# Patient Record
Sex: Female | Born: 1985 | Race: Black or African American | Hispanic: No | Marital: Single | Smoking: Former smoker
Health system: Southern US, Community
[De-identification: ages and names within clinical notes are randomized; demographics above are authoritative.]

## PROBLEM LIST (undated history)

## (undated) DIAGNOSIS — J40 Bronchitis, not specified as acute or chronic: Secondary | ICD-10-CM

## (undated) DIAGNOSIS — Z9109 Other allergy status, other than to drugs and biological substances: Secondary | ICD-10-CM

---

## 2012-02-04 ENCOUNTER — Encounter (HOSPITAL_BASED_OUTPATIENT_CLINIC_OR_DEPARTMENT_OTHER): Payer: Self-pay

## 2012-02-04 ENCOUNTER — Emergency Department (HOSPITAL_BASED_OUTPATIENT_CLINIC_OR_DEPARTMENT_OTHER): Payer: Self-pay

## 2012-02-04 ENCOUNTER — Emergency Department (HOSPITAL_BASED_OUTPATIENT_CLINIC_OR_DEPARTMENT_OTHER)
Admission: EM | Admit: 2012-02-04 | Discharge: 2012-02-04 | Disposition: A | Discharge status: Home / Self Care | Payer: Self-pay | Attending: Emergency Medicine | Admitting: Emergency Medicine

## 2012-02-04 DIAGNOSIS — J40 Bronchitis, not specified as acute or chronic: Secondary | ICD-10-CM | POA: Insufficient documentation

## 2012-02-04 DIAGNOSIS — Z87891 Personal history of nicotine dependence: Secondary | ICD-10-CM | POA: Insufficient documentation

## 2012-02-04 MED ORDER — ALBUTEROL SULFATE HFA 108 (90 BASE) MCG/ACT IN AERS: 1.0000 | INHALATION_SPRAY | Freq: Four times a day (QID) | RESPIRATORY_TRACT | Status: AC | PRN

## 2012-02-04 MED ORDER — ALBUTEROL SULFATE (5 MG/ML) 0.5% IN NEBU
5.0000 mg | INHALATION_SOLUTION | Freq: Once | RESPIRATORY_TRACT | Status: AC
  Administered 2012-02-04: 5 mg via RESPIRATORY_TRACT
  Filled 2012-02-04: qty 1

## 2012-02-04 MED ORDER — AZITHROMYCIN 250 MG PO TABS: ORAL_TABLET | ORAL | Status: AC

## 2012-02-04 NOTE — ED Provider Notes (Signed)
Medical screening examination/treatment/procedure(s) were performed by non-physician practitioner and as supervising physician I was immediately available for consultation/collaboration.   Tedford Berg, MD 02/04/12 2342 

## 2012-02-04 NOTE — ED Notes (Signed)
C/o prod cough x 2-3 months

## 2012-02-04 NOTE — ED Provider Notes (Signed)
History     CSN: [id number]  Arrival date & time 02/04/12  1439   First MD Initiated Contact with Patient 02/04/12 1520      Chief Complaint  Patient presents with  . Cough    (Consider location/radiation/quality/duration/timing/severity/associated sxs/prior treatment) Patient is a 26 y.o. female presenting with cough. The history is provided by the patient. No language interpreter was used.  Cough This is a new problem. Episode onset: 3 months. The problem occurs constantly. The cough is productive of sputum. There has been no fever. Associated symptoms include shortness of breath. She has tried nothing for the symptoms. Risk factors: none. She is not a smoker. Her past medical history does not include pneumonia.  Pt reports she has been coughing for 3 months.   Pt reports symptoms started with sinuses.   History reviewed. No pertinent past medical history.  History reviewed. No pertinent past surgical history.  No family history on file.  History  Substance Use Topics  . Smoking status: Former Games developer  . Smokeless tobacco: Not on file  . Alcohol Use: No    OB History    Grav Para Term Preterm Abortions TAB SAB Ect Mult Living                  Review of Systems  Respiratory: Positive for cough and shortness of breath.   All other systems reviewed and are negative.    Allergies  Penicillins  Home Medications  No current outpatient prescriptions on file.  BP 127/83  Pulse 88  Temp 99 F (37.2 C) (Oral)  Resp 20  Ht 5\' 6"  (1.676 m)  Wt 230 lb (104.327 kg)  BMI 37.12 kg/m2  SpO2 100%  LMP 01/02/2012  Physical Exam  Nursing note and vitals reviewed. Constitutional: She is oriented to person, place, and time. She appears well-developed and well-nourished.  HENT:  Head: Normocephalic.  Right Ear: External ear normal.  Left Ear: External ear normal.  Nose: Nose normal.  Mouth/Throat: Oropharynx is clear and moist.  Eyes: Conjunctivae normal and EOM are  normal. Pupils are equal, round, and reactive to light.  Neck: Normal range of motion. Neck supple.  Cardiovascular: Normal rate and normal heart sounds.   Pulmonary/Chest: Effort normal and breath sounds normal.  Abdominal: Soft.  Musculoskeletal: Normal range of motion.  Neurological: She is alert and oriented to person, place, and time.  Skin: Skin is warm.  Psychiatric: She has a normal mood and affect.    ED Course  Procedures (including critical care time)  Labs Reviewed - No data to display Dg Chest 2 View  02/04/2012  *RADIOLOGY REPORT*  Clinical Data: Cough, cold, congestion.  Fever.  CHEST - 2 VIEW  Comparison: None  Findings: Heart and mediastinal contours are within normal limits. No focal opacities or effusions.  No acute bony abnormality.  IMPRESSION: No active cardiopulmonary disease.   Original Report Authenticated By: Cyndie Chime, M.D.      1. Bronchitis       MDM   rx for albuterol and zithromax       Elson Areas, [place] 02/04/12 1702

## 2012-08-31 ENCOUNTER — Encounter (HOSPITAL_BASED_OUTPATIENT_CLINIC_OR_DEPARTMENT_OTHER): Payer: Self-pay

## 2012-08-31 ENCOUNTER — Emergency Department (HOSPITAL_BASED_OUTPATIENT_CLINIC_OR_DEPARTMENT_OTHER)
Admission: EM | Admit: 2012-08-31 | Discharge: 2012-08-31 | Disposition: A | Discharge status: Home / Self Care | Payer: Self-pay | Attending: Emergency Medicine | Admitting: Emergency Medicine

## 2012-08-31 ENCOUNTER — Emergency Department (HOSPITAL_BASED_OUTPATIENT_CLINIC_OR_DEPARTMENT_OTHER): Payer: Self-pay

## 2012-08-31 DIAGNOSIS — R059 Cough, unspecified: Secondary | ICD-10-CM | POA: Insufficient documentation

## 2012-08-31 DIAGNOSIS — R05 Cough: Secondary | ICD-10-CM | POA: Insufficient documentation

## 2012-08-31 DIAGNOSIS — Z880 Allergy status to penicillin: Secondary | ICD-10-CM | POA: Insufficient documentation

## 2012-08-31 DIAGNOSIS — L259 Unspecified contact dermatitis, unspecified cause: Secondary | ICD-10-CM | POA: Insufficient documentation

## 2012-08-31 DIAGNOSIS — J9801 Acute bronchospasm: Secondary | ICD-10-CM | POA: Insufficient documentation

## 2012-08-31 DIAGNOSIS — Z79899 Other long term (current) drug therapy: Secondary | ICD-10-CM | POA: Insufficient documentation

## 2012-08-31 DIAGNOSIS — R062 Wheezing: Secondary | ICD-10-CM | POA: Insufficient documentation

## 2012-08-31 DIAGNOSIS — Z87891 Personal history of nicotine dependence: Secondary | ICD-10-CM | POA: Insufficient documentation

## 2012-08-31 DIAGNOSIS — L309 Dermatitis, unspecified: Secondary | ICD-10-CM

## 2012-08-31 DIAGNOSIS — Z8709 Personal history of other diseases of the respiratory system: Secondary | ICD-10-CM | POA: Insufficient documentation

## 2012-08-31 HISTORY — DX: Bronchitis, not specified as acute or chronic: J40

## 2012-08-31 HISTORY — DX: Other allergy status, other than to drugs and biological substances: Z91.09

## 2012-08-31 MED ORDER — PREDNISONE 50 MG PO TABS
60.0000 mg | ORAL_TABLET | Freq: Once | ORAL | Status: AC
  Administered 2012-08-31: 60 mg via ORAL
  Filled 2012-08-31: qty 1

## 2012-08-31 MED ORDER — IPRATROPIUM BROMIDE 0.02 % IN SOLN
RESPIRATORY_TRACT | Status: AC
  Administered 2012-08-31: 0.5 mg via RESPIRATORY_TRACT
  Filled 2012-08-31: qty 2.5

## 2012-08-31 MED ORDER — PREDNISONE 20 MG PO TABS: 40.0000 mg | ORAL_TABLET | Freq: Every day | ORAL | Status: AC

## 2012-08-31 MED ORDER — ALBUTEROL SULFATE HFA 108 (90 BASE) MCG/ACT IN AERS: 1.0000 | INHALATION_SPRAY | Freq: Four times a day (QID) | RESPIRATORY_TRACT | Status: AC | PRN

## 2012-08-31 MED ORDER — ALBUTEROL SULFATE (5 MG/ML) 0.5% IN NEBU
INHALATION_SOLUTION | RESPIRATORY_TRACT | Status: AC
  Administered 2012-08-31: 5 mg via RESPIRATORY_TRACT
  Filled 2012-08-31: qty 1

## 2012-08-31 MED ORDER — ALBUTEROL SULFATE HFA 108 (90 BASE) MCG/ACT IN AERS
2.0000 | INHALATION_SPRAY | Freq: Once | RESPIRATORY_TRACT | Status: DC
  Filled 2012-08-31: qty 6.7

## 2012-08-31 MED ORDER — IPRATROPIUM BROMIDE 0.02 % IN SOLN
0.5000 mg | Freq: Once | RESPIRATORY_TRACT | Status: AC
  Administered 2012-08-31: 0.5 mg via RESPIRATORY_TRACT

## 2012-08-31 MED ORDER — BETAMETHASONE VALERATE 0.1 % EX CREA: TOPICAL_CREAM | Freq: Two times a day (BID) | CUTANEOUS | Status: AC

## 2012-08-31 MED ORDER — ALBUTEROL SULFATE (5 MG/ML) 0.5% IN NEBU
5.0000 mg | INHALATION_SOLUTION | Freq: Once | RESPIRATORY_TRACT | Status: AC
  Administered 2012-08-31: 5 mg via RESPIRATORY_TRACT

## 2012-08-31 NOTE — ED Notes (Signed)
RT Note: Patient was instructed on proper MDI use with spacer. Patient demonstrated technique well with no complications. She was familiar with using an inhaler. Patient had no questions currently. Rt will continue to monitor.

## 2012-08-31 NOTE — ED Notes (Signed)
Pt states that she is having shortness of breath, in nad at this time, upper airway congestion heard, LS clear.  Pt states that she was just admitted to [facility] for 3 days, dx bronchitis.

## 2012-09-07 NOTE — ED Provider Notes (Signed)
History    27 year old female with cough and shortness of breath. Gradual onset yesterday. Progressively worsening. No fevers or chills. Mild shortness of breath. Denies any chest or back pain. No unusual leg pain or swelling. Patient states recent hospitalization at outside facility several weeks ago with bronchitis.   CSN: [id number]  Arrival date & time 08/31/12  0806   First MD Initiated Contact with Patient 08/31/12 [phone]      Chief Complaint  Patient presents with  . Shortness of Breath    (Consider location/radiation/quality/duration/timing/severity/associated sxs/prior treatment) HPI  Past Medical History  Diagnosis Date  . Bronchitis   . Environmental allergies     History reviewed. No pertinent past surgical history.  History reviewed. No pertinent family history.  History  Substance Use Topics  . Smoking status: Former Games developer  . Smokeless tobacco: Not on file  . Alcohol Use: No    OB History   Grav Para Term Preterm Abortions TAB SAB Ect Mult Living                  Review of Systems  All systems reviewed and negative, other than as noted in HPI.   Allergies  Penicillins  Home Medications   Current Outpatient Rx  Name  Route  Sig  Dispense  Refill  . albuterol (PROVENTIL HFA;VENTOLIN HFA) 108 (90 BASE) MCG/ACT inhaler   Inhalation   Inhale 1-2 puffs into the lungs every 6 (six) hours as needed for wheezing.   1 Inhaler   0   . albuterol (PROVENTIL HFA;VENTOLIN HFA) 108 (90 BASE) MCG/ACT inhaler   Inhalation   Inhale 1-2 puffs into the lungs every 6 (six) hours as needed for wheezing.   1 Inhaler   2   . azithromycin (ZITHROMAX Z-PAK) 250 MG tablet      Two tablets 1st day then one tablet a day   6 tablet   0   . betamethasone valerate (VALISONE) 0.1 % cream   Topical   Apply topically 2 (two) times daily.   30 g   0   . predniSONE (DELTASONE) 20 MG tablet   Oral   Take 2 tablets (40 mg total) by mouth daily.   10 tablet   0      BP 147/80  Pulse 119  Temp(Src) 98.3 F (36.8 C) (Oral)  Resp 23  Ht 5\' 4"  (1.626 m)  Wt 230 lb (104.327 kg)  BMI 39.46 kg/m2  SpO2 99%  LMP 08/24/2012  Physical Exam  Nursing note and vitals reviewed. Constitutional: She appears well-developed and well-nourished. No distress.  HENT:  Head: Normocephalic and atraumatic.  Eyes: Conjunctivae are normal. Right eye exhibits no discharge. Left eye exhibits no discharge.  Neck: Neck supple.  Cardiovascular: Normal rate, regular rhythm and normal heart sounds.  Exam reveals no gallop and no friction rub.   No murmur heard. Pulmonary/Chest: Effort normal. No respiratory distress. She has wheezes.  Mild expiratory wheezing bilaterally. Speaking in complete sentences. No accessory muscle usage.  Abdominal: Soft. She exhibits no distension. There is no tenderness.  Musculoskeletal: She exhibits no edema and no tenderness.  Lower extremities symmetric as compared to each other. No calf tenderness. Negative Homan's. No palpable cords.   Neurological: She is alert.  Skin: Skin is warm and dry.  Psychiatric: She has a normal mood and affect. Her behavior is normal. Thought content normal.    ED Course  Procedures (including critical care time)  Labs Reviewed - No data to  display No results found.   1. Bronchospasm   2. Eczema       MDM  27 year old female with likely bronchitis. Chest x-ray is clear. No indication for antibiotics. Plan course of steroids. Patient was given the albuterol inhaler prior to discharge. She is afebrile. No significant respiratory distress on exam.       Raeford Razor, MD 09/07/12 [phone]

## 2012-11-23 ENCOUNTER — Encounter (HOSPITAL_BASED_OUTPATIENT_CLINIC_OR_DEPARTMENT_OTHER): Payer: Self-pay | Admitting: *Deleted

## 2012-11-23 ENCOUNTER — Emergency Department (HOSPITAL_BASED_OUTPATIENT_CLINIC_OR_DEPARTMENT_OTHER)
Admission: EM | Admit: 2012-11-23 | Discharge: 2012-11-23 | Disposition: A | Discharge status: Home / Self Care | Payer: Self-pay | Attending: Emergency Medicine | Admitting: Emergency Medicine

## 2012-11-23 DIAGNOSIS — Z87891 Personal history of nicotine dependence: Secondary | ICD-10-CM | POA: Insufficient documentation

## 2012-11-23 DIAGNOSIS — L0231 Cutaneous abscess of buttock: Secondary | ICD-10-CM | POA: Insufficient documentation

## 2012-11-23 DIAGNOSIS — Z9109 Other allergy status, other than to drugs and biological substances: Secondary | ICD-10-CM | POA: Insufficient documentation

## 2012-11-23 DIAGNOSIS — L0291 Cutaneous abscess, unspecified: Secondary | ICD-10-CM

## 2012-11-23 DIAGNOSIS — IMO0002 Reserved for concepts with insufficient information to code with codable children: Secondary | ICD-10-CM | POA: Insufficient documentation

## 2012-11-23 DIAGNOSIS — J40 Bronchitis, not specified as acute or chronic: Secondary | ICD-10-CM | POA: Insufficient documentation

## 2012-11-23 DIAGNOSIS — Z79899 Other long term (current) drug therapy: Secondary | ICD-10-CM | POA: Insufficient documentation

## 2012-11-23 DIAGNOSIS — L03317 Cellulitis of buttock: Secondary | ICD-10-CM | POA: Insufficient documentation

## 2012-11-23 DIAGNOSIS — Z880 Allergy status to penicillin: Secondary | ICD-10-CM | POA: Insufficient documentation

## 2012-11-23 DIAGNOSIS — Z792 Long term (current) use of antibiotics: Secondary | ICD-10-CM | POA: Insufficient documentation

## 2012-11-23 DIAGNOSIS — L02419 Cutaneous abscess of limb, unspecified: Secondary | ICD-10-CM | POA: Insufficient documentation

## 2012-11-23 MED ORDER — SULFAMETHOXAZOLE-TRIMETHOPRIM 800-160 MG PO TABS: 1.0000 | ORAL_TABLET | Freq: Two times a day (BID) | ORAL | Status: AC

## 2012-11-23 MED ORDER — LIDOCAINE-EPINEPHRINE-TETRACAINE (LET) SOLUTION
3.0000 mL | Freq: Once | NASAL | Status: AC
  Administered 2012-11-23: 6 mL via TOPICAL
  Filled 2012-11-23: qty 6

## 2012-11-23 MED ORDER — HYDROCODONE-ACETAMINOPHEN 5-325 MG PO TABS: 1.0000 | ORAL_TABLET | Freq: Four times a day (QID) | ORAL | Status: AC | PRN

## 2012-11-23 NOTE — ED Notes (Signed)
Pt c/o abscess to right hip and rash x 1 week

## 2012-11-23 NOTE — ED Notes (Signed)
Pt's wounds covered with gauze and taped in place.

## 2012-11-23 NOTE — ED Provider Notes (Addendum)
History    CSN: [id number] Arrival date & time 11/23/12  1850  First MD Initiated Contact with Patient 11/23/12 1903     Chief Complaint  Patient presents with  . Abscess   (Consider location/radiation/quality/duration/timing/severity/associated sxs/prior Treatment) Patient is a 27 y.o. female presenting with abscess. The history is provided by the patient.  Abscess Location:  Leg Leg abscess location:  R hip Abscess quality: fluctuance, induration, painful, redness and warmth   Abscess quality: not draining   Red streaking: no   Duration:  1 week Progression:  Worsening Pain details:    Quality:  Sharp and throbbing   Severity:  Moderate   Timing:  Constant   Progression:  Worsening Chronicity:  New Context comment:  Worsening rash that itches and she scratches all the time Worsened by:  Nothing tried Ineffective treatments:  Warm water soaks Associated symptoms: no fever, no nausea and no vomiting   Risk factors: prior abscess    Past Medical History  Diagnosis Date  . Bronchitis   . Environmental allergies    History reviewed. No pertinent past surgical history. History reviewed. No pertinent family history. History  Substance Use Topics  . Smoking status: Former Games developer  . Smokeless tobacco: Not on file  . Alcohol Use: No   OB History   Grav Para Term Preterm Abortions TAB SAB Ect Mult Living                 Review of Systems  Constitutional: Negative for fever.  Gastrointestinal: Negative for nausea and vomiting.  All other systems reviewed and are negative.    Allergies  Penicillins  Home Medications   Current Outpatient Rx  Name  Route  Sig  Dispense  Refill  . albuterol (PROVENTIL HFA;VENTOLIN HFA) 108 (90 BASE) MCG/ACT inhaler   Inhalation   Inhale 1-2 puffs into the lungs every 6 (six) hours as needed for wheezing.   1 Inhaler   0   . albuterol (PROVENTIL HFA;VENTOLIN HFA) 108 (90 BASE) MCG/ACT inhaler   Inhalation   Inhale 1-2 puffs  into the lungs every 6 (six) hours as needed for wheezing.   1 Inhaler   2   . azithromycin (ZITHROMAX Z-PAK) 250 MG tablet      Two tablets 1st day then one tablet a day   6 tablet   0   . betamethasone valerate (VALISONE) 0.1 % cream   Topical   Apply topically 2 (two) times daily.   30 g   0   . predniSONE (DELTASONE) 20 MG tablet   Oral   Take 2 tablets (40 mg total) by mouth daily.   10 tablet   0    BP 136/88  Pulse 100  Temp(Src) 98.5 F (36.9 C) (Oral)  Resp 20  Ht 5\' 3"  (1.6 m)  Wt 230 lb (104.327 kg)  BMI 40.75 kg/m2  SpO2 100%  LMP 11/22/2012 Physical Exam  Nursing note and vitals reviewed. Constitutional: She is oriented to person, place, and time. She appears well-developed and well-nourished. No distress.  HENT:  Head: Normocephalic and atraumatic.  Mouth/Throat: Oropharynx is clear and moist.  Eyes: Conjunctivae and EOM are normal. Pupils are equal, round, and reactive to light.  Neck: Normal range of motion. Neck supple.  Cardiovascular: Normal rate, regular rhythm and intact distal pulses.   No murmur heard. Pulmonary/Chest: Effort normal and breath sounds normal. No respiratory distress. She has no wheezes. She has no rales.  Abdominal: Soft. She exhibits  no distension. There is no tenderness. There is no rebound and no guarding.  Musculoskeletal: Normal range of motion. She exhibits no edema and no tenderness.  Neurological: She is alert and oriented to person, place, and time.  Skin: Skin is warm and dry. Rash noted. Rash is maculopapular. No erythema.     2 large fluctuanct areas over the right hip that are painful, warm, indurated and painful.  Diffuse excoriated, dry, flaking rash starting below the ears and involves the entire body to the toes.  Severe excoriation of the lower legs with chronic skin changes and open wounds.   Psychiatric: She has a normal mood and affect. Her behavior is normal.    ED Course  Procedures (including  critical care time) Labs Reviewed - No data to display No results found. INCISION AND DRAINAGE Performed by: Gwyneth Sprout Consent: Verbal consent obtained. Risks and benefits: risks, benefits and alternatives were discussed Type: abscess  Body area: Right hip  Anesthesia: local infiltration  Incision was made with a scalpel.  Local anesthetic: lidocaine 1 % with epinephrine  Anesthetic total: 5 ml  Complexity: complex Blunt dissection to break up loculations  Drainage: purulent  Drainage amount: 20 mL   Packing material: None Patient tolerance: Patient tolerated the procedure well with no immediate complications.   INCISION AND DRAINAGE Performed by: Gwyneth Sprout Consent: Verbal consent obtained. Risks and benefits: risks, benefits and alternatives were discussed Type: abscess  Body area: Right buttock  Anesthesia: local infiltration  Incision was made with a scalpel.  Local anesthetic: lidocaine 1 % with epinephrine  Anesthetic total: 5 ml  Complexity: complex Blunt dissection to break up loculations  Drainage: purulent  Drainage amount: 30 mL   Packing material: None   Patient tolerance: Patient tolerated the procedure well with no immediate complications.    1. Abscess     MDM   Patient with 2 abscesses present on her right hip and buttocks that are large and bedside ultrasound confirms fluid collection. However patient also has a diffuse rash involving 90% of her body that is dry, peeling and flaking. Diffusely over her body is excoriation and on her lower extremities bilaterally she has small open wounds and chronic skin changes from scratching.  We'll I&D abscesses however feel that patient needs referral to dermatology for further investigation and treatment of her rash.  Gwyneth Sprout, MD 11/23/12 1478  Gwyneth Sprout, MD 11/23/12 2312

## 2014-06-23 IMAGING — CR DG CHEST 2V
2 series · 2 of 2 positions shown · non-contrast
Comparison: 02/04/2012

***ADDENDUM*** CREATED: 08/31/2012 [DATE]

History should read cough
***END ADDENDUM*** SIGNED BY: Saulenas Foot, M.D.
CLINICAL DATA: 02/04/2012 cough
CHEST - 2 VIEW

[w chest pa]
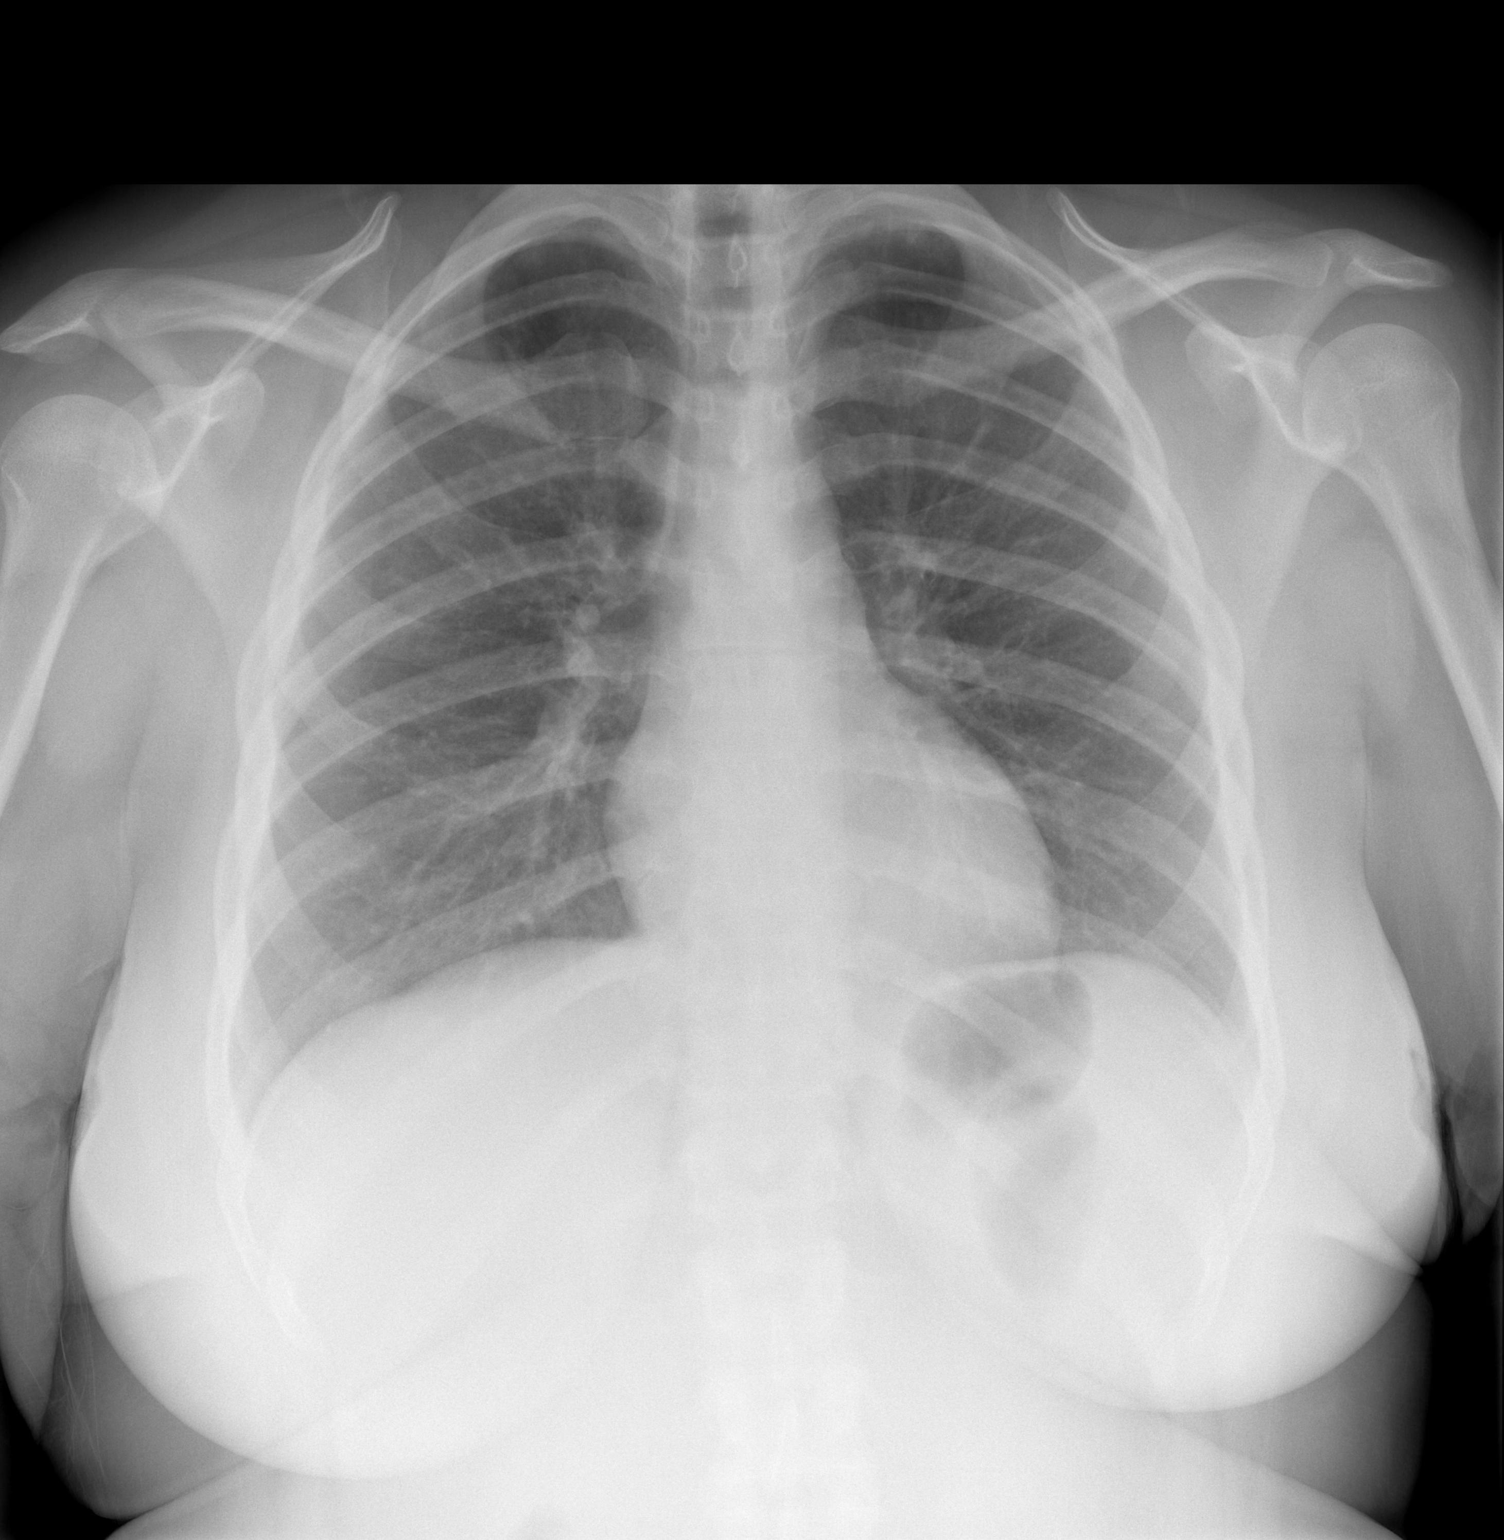

[w chest lat]
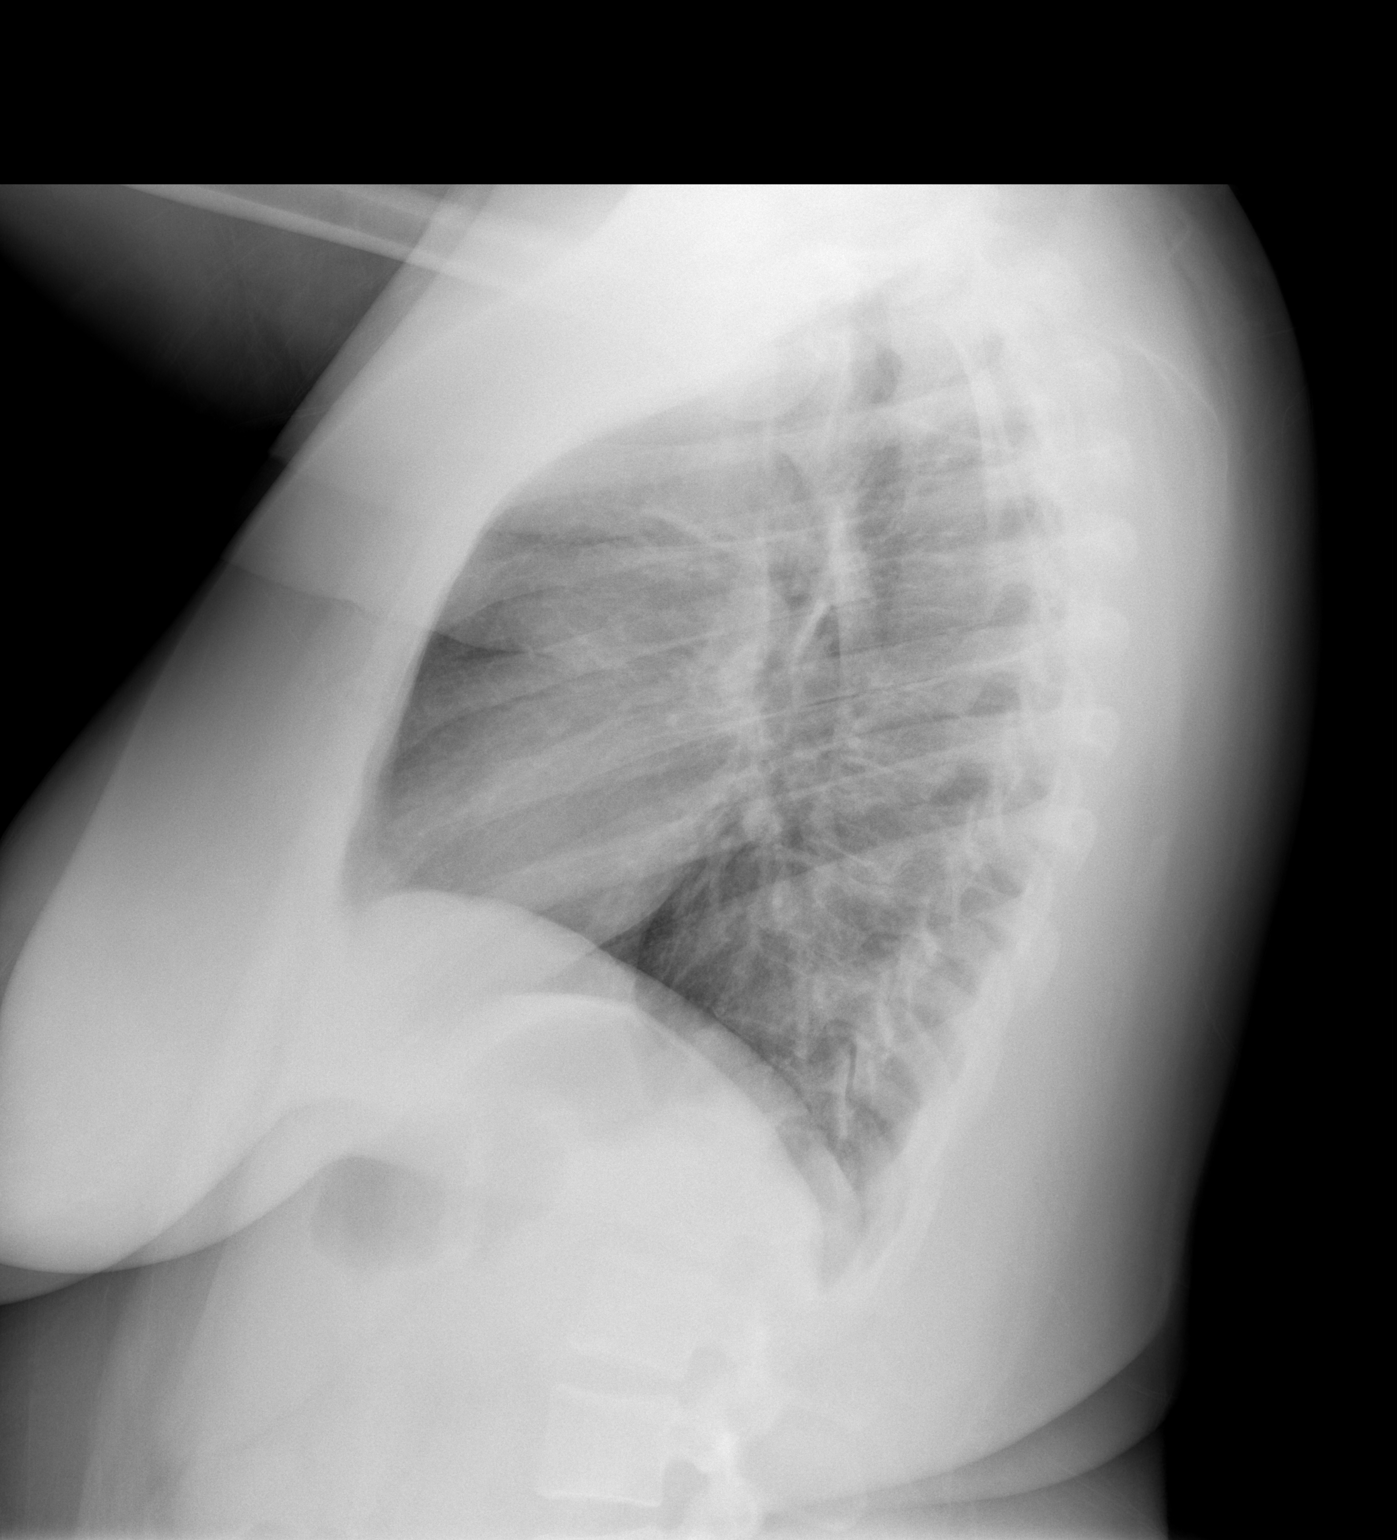

[2 of 2 positions shown; findings below may reference images not displayed]

FINDINGS: The heart and pulmonary vascularity are within normal
limits.  The lungs are clear bilaterally.
IMPRESSION: No acute infiltrate or sizable effusion is noted.

## 2018-11-10 ENCOUNTER — Encounter: Payer: Self-pay | Admitting: Family Medicine

## 2018-11-10 DIAGNOSIS — Z3490 Encounter for supervision of normal pregnancy, unspecified, unspecified trimester: Secondary | ICD-10-CM

## 2018-11-10 NOTE — Progress Notes (Signed)
Patient did not keep appointment today. She may call to reschedule.  

## 2024-05-25 ENCOUNTER — Other Ambulatory Visit: Payer: Self-pay
